# Patient Record
Sex: Male | Born: 1975 | Race: White | Hispanic: No | Marital: Married | Smoking: Former smoker
Health system: Southern US, Community
[De-identification: ages and names within clinical notes are randomized; demographics above are authoritative.]

## PROBLEM LIST (undated history)

## (undated) DIAGNOSIS — I10 Essential (primary) hypertension: Secondary | ICD-10-CM

## (undated) HISTORY — DX: Essential (primary) hypertension: I10

---

## 2006-11-20 ENCOUNTER — Ambulatory Visit: Payer: Self-pay | Admitting: Family Medicine

## 2007-01-22 ENCOUNTER — Ambulatory Visit: Payer: Self-pay | Admitting: Family Medicine

## 2007-01-22 DIAGNOSIS — I10 Essential (primary) hypertension: Secondary | ICD-10-CM | POA: Insufficient documentation

## 2007-01-22 LAB — CONVERTED CEMR LAB
ALT: 39 units/L (ref 0–53)
AST: 26 units/L (ref 0–37)
Albumin: 4.4 g/dL (ref 3.5–5.2)
Alkaline Phosphatase: 64 units/L (ref 39–117)
Calcium: 9.1 mg/dL (ref 8.4–10.5)
Creatinine, Ser: 1.14 mg/dL (ref 0.40–1.50)
Glucose, Bld: 86 mg/dL (ref 70–99)
HDL: 33 mg/dL — ABNORMAL LOW (ref >39)
Potassium: 4.1 meq/L (ref 3.5–5.3)
Total Bilirubin: 1 mg/dL (ref 0.3–1.2)
Triglycerides: 62 mg/dL (ref <150)

## 2007-01-23 ENCOUNTER — Encounter: Payer: Self-pay | Admitting: Family Medicine

## 2007-01-23 ENCOUNTER — Telehealth (INDEPENDENT_AMBULATORY_CARE_PROVIDER_SITE_OTHER): Payer: Self-pay | Admitting: *Deleted

## 2007-02-11 ENCOUNTER — Telehealth: Payer: Self-pay | Admitting: Family Medicine

## 2007-03-05 ENCOUNTER — Ambulatory Visit: Payer: Self-pay | Admitting: Family Medicine

## 2007-03-05 DIAGNOSIS — E785 Hyperlipidemia, unspecified: Secondary | ICD-10-CM | POA: Insufficient documentation

## 2007-03-05 LAB — CONVERTED CEMR LAB
Calcium: 9.6 mg/dL (ref 8.4–10.5)
Sodium: 139 meq/L (ref 135–145)

## 2007-03-06 ENCOUNTER — Telehealth (INDEPENDENT_AMBULATORY_CARE_PROVIDER_SITE_OTHER): Payer: Self-pay | Admitting: *Deleted

## 2007-05-20 ENCOUNTER — Ambulatory Visit: Payer: Self-pay | Admitting: Family Medicine

## 2007-05-20 ENCOUNTER — Encounter: Admission: RE | Admit: 2007-05-20 | Discharge: 2007-05-20 | Payer: Self-pay | Admitting: Family Medicine

## 2007-05-20 DIAGNOSIS — M25539 Pain in unspecified wrist: Secondary | ICD-10-CM

## 2007-06-14 ENCOUNTER — Ambulatory Visit: Payer: Self-pay | Admitting: Family Medicine

## 2007-06-14 ENCOUNTER — Encounter: Admission: RE | Admit: 2007-06-14 | Discharge: 2007-06-14 | Payer: Self-pay | Admitting: Family Medicine

## 2007-07-22 ENCOUNTER — Encounter: Payer: Self-pay | Admitting: Family Medicine

## 2007-07-24 ENCOUNTER — Telehealth: Payer: Self-pay | Admitting: Family Medicine

## 2007-08-16 ENCOUNTER — Encounter: Payer: Self-pay | Admitting: Family Medicine

## 2007-09-25 ENCOUNTER — Ambulatory Visit: Payer: Self-pay | Admitting: Family Medicine

## 2008-03-13 ENCOUNTER — Telehealth: Payer: Self-pay | Admitting: Family Medicine

## 2008-03-16 ENCOUNTER — Ambulatory Visit: Payer: Self-pay | Admitting: Family Medicine

## 2008-03-17 ENCOUNTER — Encounter: Payer: Self-pay | Admitting: Family Medicine

## 2008-03-17 LAB — CONVERTED CEMR LAB
BUN: 12 mg/dL (ref 6–23)
CO2: 21 meq/L (ref 19–32)
Calcium: 9.2 mg/dL (ref 8.4–10.5)
Chloride: 107 meq/L (ref 96–112)
Creatinine, Ser: 0.98 mg/dL (ref 0.40–1.50)
HDL: 38 mg/dL — ABNORMAL LOW (ref >39)
LDL Cholesterol: 83 mg/dL (ref 0–99)
Total Bilirubin: 0.5 mg/dL (ref 0.3–1.2)
Total CHOL/HDL Ratio: 3.6

## 2008-03-18 ENCOUNTER — Encounter: Payer: Self-pay | Admitting: Family Medicine

## 2008-05-04 ENCOUNTER — Ambulatory Visit: Payer: Self-pay | Admitting: Family Medicine

## 2008-05-04 DIAGNOSIS — J1089 Influenza due to other identified influenza virus with other manifestations: Secondary | ICD-10-CM

## 2009-01-05 ENCOUNTER — Ambulatory Visit: Payer: Self-pay | Admitting: Family Medicine

## 2009-01-05 DIAGNOSIS — L03019 Cellulitis of unspecified finger: Secondary | ICD-10-CM | POA: Insufficient documentation

## 2009-01-05 DIAGNOSIS — R509 Fever, unspecified: Secondary | ICD-10-CM

## 2009-01-05 LAB — CONVERTED CEMR LAB
Basophils Absolute: 0 10*3/uL (ref 0.0–0.1)
Basophils Relative: 0 % (ref 0–1)
Hemoglobin: 15 g/dL (ref 13.0–17.0)
Lymphocytes Relative: 20 % (ref 12–46)
MCV: 85.9 fL (ref 78.0–100.0)
Platelets: 232 10*3/uL (ref 150–400)
RDW: 12.4 % (ref 11.5–15.5)
WBC: 5.5 10*3/uL (ref 4.0–10.5)

## 2009-04-09 ENCOUNTER — Ambulatory Visit: Payer: Self-pay | Admitting: Family Medicine

## 2009-04-12 LAB — CONVERTED CEMR LAB
AST: 17 units/L (ref 0–37)
Alkaline Phosphatase: 62 units/L (ref 39–117)
CO2: 25 meq/L (ref 19–32)
Cholesterol: 135 mg/dL (ref 0–200)
Creatinine, Ser: 0.93 mg/dL (ref 0.40–1.50)
Glucose, Bld: 85 mg/dL (ref 70–99)
LDL Cholesterol: 78 mg/dL (ref 0–99)

## 2009-07-02 ENCOUNTER — Ambulatory Visit: Payer: Self-pay | Admitting: Family Medicine

## 2009-07-02 DIAGNOSIS — J0100 Acute maxillary sinusitis, unspecified: Secondary | ICD-10-CM

## 2009-08-04 ENCOUNTER — Telehealth: Payer: Self-pay | Admitting: Family Medicine

## 2010-07-20 NOTE — Progress Notes (Signed)
Summary: change ARB  Phone Note From Pharmacy   Caller: [address] # [phone]* Summary of Call: Pls let pt know that his Benicar 20 mg is not covered by insurance.  I am going to change him to Losartan 50 mg once daily.  Have him return for a nurse BP check in 3 wks to make sure he is at goal.   Initial call taken by: Seymour Bars DO,  August 04, 2009 8:09 AM    New/Updated Medications: LOSARTAN POTASSIUM 50 MG TABS (LOSARTAN POTASSIUM) 1 tab by mouth daily Prescriptions: LOSARTAN POTASSIUM 50 MG TABS (LOSARTAN POTASSIUM) 1 tab by mouth daily  #30 x 2   Entered and Authorized by:   Seymour Bars DO   Signed by:   Seymour Bars DO on 08/04/2009   Method used:   Electronically to        [facility] # [phone]* (retail)       [address]       [place], [place]  [address]       Ph: [phone]       Fax: [phone]   RxID:   [id number]   Appended Document: change ARB Pt's wife aware

## 2010-07-20 NOTE — Assessment & Plan Note (Signed)
Summary: HTN   Vital Signs:  Patient Profile:   35 Years Old Male Height:     71.5 inches Weight:      223 pounds BMI:     30.78 Pulse rate:   73 / minute BP sitting:   135 / 85  (left arm) Cuff size:   large  Vitals Entered By: Harlene Salts (March 16, 2008 8:48 AM)                 Visit Type:  f/u PCP:  Seymour Bars D.O.  Chief Complaint:  follow-up BP.  History of Present Illness: 35 yo WM presents for f/u HTN.  Doing well on Benicar, covered by his insurance.  Ran out of meds about 3 days ago.  Denies  CP, SOB or edema.  Not exercising. Eats a lot of fast food.  Has a lot of stress at work.        Current Allergies: ! 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       Married to [employer].         Has 36 yo daughter       Quit smoking in 1998.  2 ETOH per wk.       Condomns for birth control.         2 sodas a day.       exercises regularly.    Review of Systems      See HPI   Physical Exam  General:     alert, well-developed, well-nourished, and well-hydrated.   Head:     normocephalic and atraumatic.   Eyes:     pupils equal, pupils round, and pupils reactive to light.   Nose:     no nasal discharge.   Mouth:     good dentition.   Neck:     no masses.   Lungs:     normal respiratory effort and normal breath sounds.   Heart:     normal rate, regular rhythm, and no murmur.   Extremities:     No clubbing, cyanosis, edema, or deformity noted  Psych:     good eye contact, not anxious appearing, and not depressed appearing.      Impression &  Recommendations:  Problem # 1:  HYPERTENSION, BENIGN ESSENTIAL (ICD-401.1) BP in pre-HTN range but he is out of his meds.  RF'd Benicar 20 mg and given 3 wks of samples today.  Update labs.  RTC in 6 months.  Needs to work on diet, exercise.  Cut out fast food. His updated medication list for this problem includes:    Benicar 20 Mg Tabs (Olmesartan medoxomil) .Marland Kitchen... 1 tab by mouth daily  Orders: T-Comprehensive Metabolic Panel ([id number])   Problem # 2:  DYSLIPIDEMIA (ICD-272.4) Due for labs.  Not on any meds.  Hx of low HDL.  Discussed diet/ exercise recommendations.   Orders: T-Comprehensive Metabolic Panel [phone]) T-Lipid Profile [phone])  Labs Reviewed: Chol: 110 (01/22/2007)  HDL: 33 (01/22/2007)   LDL: 65 (01/22/2007)   TG: 62 (01/22/2007) SGOT: 26 (01/22/2007)   SGPT: 39 (01/22/2007)   Complete Medication List: 1)  Benicar 20 Mg Tabs (Olmesartan medoxomil) .Marland Kitchen.. 1 tab by mouth daily   Patient Instructions: 1)  Stay on Benicar 20 mg daily. 2)  Have fasting labs drawn and I will mail your results. 3)  Work on [employer], exercise, weight loss. 4)  Follow up for BP/ cholestrol in 6 mos.   Prescriptions: BENICAR 20 MG  TABS (OLMESARTAN MEDOXOMIL) 1 tab by mouth daily  #90 x 2   Entered and Authorized by:   Seymour Bars DO   Signed by:   Seymour Bars DO on 03/16/2008   Method used:   Electronically to        [facility] [phone]* (retail)       [address] [place], [place]  [address]       Ph: [phone]       Fax: [phone]   RxID:   [id number]  ]

## 2010-07-20 NOTE — Assessment & Plan Note (Signed)
Summary: sinusitis   Vital Signs:  Patient profile:   35 year old male Height:      71.5 inches Weight:      222 pounds BMI:     30.64 O2 Sat:      98 % on Room air Temp:     98.8 degrees F oral Pulse rate:   80 / minute BP sitting:   122 / 74  (left arm) Cuff size:   large  Vitals Entered By: Payton Spark CMA (July 02, 2009 1:09 PM)  O2 Flow:  Room air CC: Head congestion, ST and cough x 2 weeks.    Primary Care Provider:  Seymour Bars D.O.  CC:  Head congestion and ST and cough x 2 weeks. Marland Kitchen  History of Present Illness: Andrew Wyatt presents for 2 wks of head congestion, rhinorrhea.  Has had ST which is better and cough.  He had a fever of 100 2 days ago.  He had had bodyaches and HA.  Cough is dry.  No chest tightness.  No SOB.  He is taking Nyquil at night and sudafed during the day.  Mucinex did not help at all.  His daughter has had a cold.      Current Medications (verified): 1)  Benicar 20 Mg Tabs (Olmesartan Medoxomil) .Marland Kitchen.. 1 Tab By Mouth Daily  Allergies (verified): 1)  ! Pcn 2)  Lisinopril (Lisinopril)  Past History:  Past Medical History: Reviewed history from 09/25/2007 and no changes required. HTN  Past Surgical History: Reviewed history from 11/20/2006 and no changes required. Denies surgical history  Social History: Reviewed history from 11/20/2006 and no changes required. 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   Has 75 yo daughter Quit smoking in 1998.  2 ETOH per wk. Condomns for birth control.   2 sodas a day. exercises regularly.  Review of Systems      See HPI  Physical Exam  General:  alert, well-developed, well-nourished, and well-hydrated.   Head:  normocephalic and atraumatic.  frontal and maxillary sinuses TTP Eyes:  conjunctiva clear, EOMI Ears:  EACs patent; TMs translucent and gray with good cone of light and bony landmarks.  Nose:  nasal congesiton present Mouth:  throat w/o injection, clear  postnasal drip Neck:  no masses.   Lungs:  Normal respiratory effort, chest expands symmetrically. Lungs are clear to auscultation, no crackles or wheezes. Heart:  Normal rate and regular rhythm. S1 and S2 normal without gallop, murmur, click, rub or other extra sounds. Skin:  color normal.   Cervical Nodes:  No lymphadenopathy noted   Impression & Recommendations:  Problem # 1:  ACUTE MAXILLARY SINUSITIS (ICD-461.0) 2 wks into illness, starting with a URI.  Treat with Zithromax x 5 days.  Use OTC Mucinex DM and nasal afrin symptomatic relief. Call if not improving in a wk. His updated medication list for this problem includes:    Zithromax Z-pak 250 Mg Tabs (Azithromycin) .Marland Kitchen... 2 tabs by mouth x 1 day then 1 tab by mouth daily x 4 days  Complete Medication List: 1)  Benicar 20 Mg Tabs (Olmesartan medoxomil) .Marland Kitchen.. 1 tab by mouth daily 2)  Zithromax Z-pak 250 Mg Tabs (Azithromycin) .... 2 tabs by mouth x 1 day then 1 tab by mouth daily x 4 days  Patient Instructions: 1)  Take 5 days of Zithromax for sinusitis. 2)  Use OTC Mucinex DM + Afrin nasal spray for symptomatic relief for the next wk. 3)  Use Tylenol or motrin for aches/ pains. 4)  Call if not improved in 7 days. Prescriptions: ZITHROMAX Z-PAK 250 MG TABS (AZITHROMYCIN) 2 tabs by mouth x 1 day then 1 tab by mouth daily x 4 days  #1 pack x 0   Entered and Authorized by:   Seymour Bars DO   Signed by:   Seymour Bars DO on 07/02/2009   Method used:   Electronically to        [facility] # [phone]* (retail)       [address]       [place], [place]  [address]       Ph: [phone]       Fax: [phone]   RxID:   [id number]

## 2010-09-26 ENCOUNTER — Inpatient Hospital Stay (INDEPENDENT_AMBULATORY_CARE_PROVIDER_SITE_OTHER)
Admission: RE | Admit: 2010-09-26 | Discharge: 2010-09-26 | Disposition: A | Discharge status: Home / Self Care | Payer: BLUE CROSS/BLUE SHIELD | Source: Ambulatory Visit | Attending: Emergency Medicine | Admitting: Emergency Medicine

## 2010-09-26 ENCOUNTER — Encounter: Payer: Self-pay | Admitting: Emergency Medicine

## 2010-09-26 DIAGNOSIS — J069 Acute upper respiratory infection, unspecified: Secondary | ICD-10-CM

## 2010-09-29 ENCOUNTER — Telehealth (INDEPENDENT_AMBULATORY_CARE_PROVIDER_SITE_OTHER): Payer: Self-pay | Admitting: *Deleted

## 2010-10-07 ENCOUNTER — Encounter: Payer: Self-pay | Admitting: Family Medicine

## 2010-10-07 ENCOUNTER — Ambulatory Visit (INDEPENDENT_AMBULATORY_CARE_PROVIDER_SITE_OTHER): Payer: BLUE CROSS/BLUE SHIELD | Admitting: Family Medicine

## 2010-10-07 VITALS — BP 132/84 | HR 83 | Temp 98.2°F | Ht 71.5 in | Wt 225.1 lb

## 2010-10-07 DIAGNOSIS — J329 Chronic sinusitis, unspecified: Secondary | ICD-10-CM

## 2010-10-07 MED ORDER — AZITHROMYCIN 250 MG PO TABS: ORAL_TABLET | ORAL | Status: AC

## 2010-10-07 NOTE — Patient Instructions (Signed)
Take 5 days of Zithromax for prolonged respiratory infection.  Take OTC Zyrtec each night for seasonal allergies. Take Advil cold and sinus for the next 7-10 days.

## 2010-10-07 NOTE — Progress Notes (Signed)
  Subjective:    Patient ID: Andrew Wyatt, male    DOB: [birth year], 35 y.o.   MRN: [id number]  HPI  35 yo WM presents for a sore throat, cough and runny nose x 3 wks.  He went to UC 2 wks and was tested for mono and was neg.  Tried almost every OTC anti histamine.  Using Afrin nasal spray which has helped a little bit.  He continues to suffer with congestion, fatigue, cough.  No fevers or chills.  Also has underlying allergies.    BP 132/84  Pulse 83  Temp(Src) 98.2 F (36.8 C) (Oral)  Ht 5' 11.5" (1.816 m)  Wt 225 lb 1.3 oz (102.096 kg)  BMI 30.95 kg/m2  SpO2 97%   Review of Systems  Constitutional: Positive for fatigue. Negative for fever and chills.  HENT: Positive for congestion, rhinorrhea, sneezing and postnasal drip. Negative for ear pain, neck pain and sinus pressure.   Eyes: Negative for itching.  Respiratory: Positive for cough. Negative for shortness of breath and wheezing.   Cardiovascular: Negative for chest pain.  Gastrointestinal: Negative for nausea.  Skin: Negative for rash.  Neurological: Negative for headaches.        Objective:   Physical Exam  Constitutional: He appears well-developed and well-nourished. No distress.  HENT:  Head: Normocephalic and atraumatic.  Right Ear: External ear normal.  Left Ear: External ear normal.  Mouth/Throat: Oropharynx is clear and moist. No oropharyngeal exudate.       Nasal congestion, boggy turbinates, o/p injected  Eyes: Conjunctivae are normal.  Neck: Neck supple.  Cardiovascular: Normal rate, regular rhythm and normal heart sounds.   Pulmonary/Chest: Effort normal and breath sounds normal. No respiratory distress. He has no wheezes.  Lymphadenopathy:    He has no cervical adenopathy.  Skin: Skin is warm and dry. No rash noted.  Psychiatric: He has a normal mood and affect.          Assessment & Plan:  1. Sinusitis- likely early ABS following URI with underlying allergies x 3 wks of symptoms and not  improving with multiple OTC agents.  Will cover with 5 days of Zithromax and short term use of Advil cold and sinus.  Call if not improvied in 19 days.

## 2010-11-29 ENCOUNTER — Telehealth: Payer: Self-pay | Admitting: Family Medicine

## 2010-11-29 NOTE — Telephone Encounter (Signed)
Pt has to try and fail Claritin, Allegra and Zyrtec to qualify for Rx anti histamines. So, if he has not tried Claritin or Zyrtec, I would recommend one of these.

## 2010-11-29 NOTE — Telephone Encounter (Addendum)
Pts wife called and pt has been taking zyrtec 10 mg daily, and he doesn't feel it's working and he would like a diff antihistamine.  Doesn't like taking benadryl because it causes sleepiness.    Please advise. Plan:  Routed to Dr. Arlice Colt, LPN Andrew Wyatt   69-62-95 @ 8:39pm  Pt notified to try another OTC antihistamine either claritin or allegra since he has already tried the zyrtec and it works but not completely.  Told him there has to be a fail of 3 antihistamines before we can give a prescription type antihistamine.  Pt voiced understanding. Andrew Newcomer, LPN Andrew Wyatt

## 2010-12-04 ENCOUNTER — Other Ambulatory Visit: Payer: Self-pay | Admitting: Family Medicine

## 2010-12-05 ENCOUNTER — Emergency Department (HOSPITAL_BASED_OUTPATIENT_CLINIC_OR_DEPARTMENT_OTHER)
Admission: EM | Admit: 2010-12-05 | Discharge: 2010-12-05 | Disposition: A | Discharge status: Home / Self Care | Payer: BC Managed Care – PPO | Attending: Emergency Medicine | Admitting: Emergency Medicine

## 2010-12-05 DIAGNOSIS — S93409A Sprain of unspecified ligament of unspecified ankle, initial encounter: Secondary | ICD-10-CM | POA: Insufficient documentation

## 2010-12-05 DIAGNOSIS — Y9373 Activity, racquet and hand sports: Secondary | ICD-10-CM | POA: Insufficient documentation

## 2010-12-05 DIAGNOSIS — X500XXA Overexertion from strenuous movement or load, initial encounter: Secondary | ICD-10-CM | POA: Insufficient documentation

## 2010-12-05 DIAGNOSIS — I10 Essential (primary) hypertension: Secondary | ICD-10-CM | POA: Insufficient documentation

## 2010-12-06 ENCOUNTER — Emergency Department (INDEPENDENT_AMBULATORY_CARE_PROVIDER_SITE_OTHER): Payer: BC Managed Care – PPO

## 2010-12-06 DIAGNOSIS — M25579 Pain in unspecified ankle and joints of unspecified foot: Secondary | ICD-10-CM

## 2010-12-06 DIAGNOSIS — M7730 Calcaneal spur, unspecified foot: Secondary | ICD-10-CM

## 2010-12-06 DIAGNOSIS — W19XXXA Unspecified fall, initial encounter: Secondary | ICD-10-CM

## 2011-02-14 ENCOUNTER — Telehealth: Payer: Self-pay | Admitting: Family Medicine

## 2011-02-14 NOTE — Telephone Encounter (Signed)
Closed

## 2011-02-16 ENCOUNTER — Ambulatory Visit (INDEPENDENT_AMBULATORY_CARE_PROVIDER_SITE_OTHER): Payer: BLUE CROSS/BLUE SHIELD | Admitting: Family Medicine

## 2011-02-16 DIAGNOSIS — Z23 Encounter for immunization: Secondary | ICD-10-CM

## 2011-02-16 NOTE — Progress Notes (Signed)
Here for Tdap

## 2011-03-08 ENCOUNTER — Telehealth: Payer: Self-pay | Admitting: Family Medicine

## 2011-03-08 NOTE — Telephone Encounter (Signed)
Pt's wife called and said pharm had requested pt BP refill, but trouble getting filled. Plan:  Reviewed the pt chart and the pt has not been seen for his BP since 2010.  Pt's wife informed that pt needs to come in for follow up for BP.  Refilled 30 day supply of losartan, and scheduled pt an appt for 03-13-11. Jarvis Newcomer, LPN [facility]

## 2011-03-12 ENCOUNTER — Encounter: Payer: Self-pay | Admitting: Family Medicine

## 2011-03-13 ENCOUNTER — Encounter: Payer: Self-pay | Admitting: Family Medicine

## 2011-03-13 ENCOUNTER — Ambulatory Visit (INDEPENDENT_AMBULATORY_CARE_PROVIDER_SITE_OTHER): Payer: BLUE CROSS/BLUE SHIELD | Admitting: Family Medicine

## 2011-03-13 VITALS — BP 142/94 | HR 58 | Wt 227.0 lb

## 2011-03-13 DIAGNOSIS — I10 Essential (primary) hypertension: Secondary | ICD-10-CM

## 2011-03-13 DIAGNOSIS — E785 Hyperlipidemia, unspecified: Secondary | ICD-10-CM

## 2011-03-13 LAB — COMPLETE METABOLIC PANEL WITH GFR
ALT: 24 U/L (ref 0–53)
AST: 20 U/L (ref 0–37)
Albumin: 4.4 g/dL (ref 3.5–5.2)
BUN: 11 mg/dL (ref 6–23)
CO2: 28 mEq/L (ref 19–32)
GFR, Est African American: >60 mL/min (ref >60)
Potassium: 4 mEq/L (ref 3.5–5.3)
Sodium: 136 mEq/L (ref 135–145)
Total Bilirubin: 0.5 mg/dL (ref 0.3–1.2)

## 2011-03-13 LAB — LIPID PANEL
Cholesterol: 130 mg/dL (ref 0–200)
LDL Cholesterol: 70 mg/dL (ref 0–99)
Total CHOL/HDL Ratio: 4.1 Ratio

## 2011-03-13 MED ORDER — LOSARTAN POTASSIUM 100 MG PO TABS: 100.0000 mg | ORAL_TABLET | Freq: Every day | ORAL | Status: DC

## 2011-03-13 NOTE — Progress Notes (Signed)
  Subjective:    Patient ID: Andrew Wyatt, male    DOB: 08/03/1975, 35 y.o.   MRN: [id number]  HPI  He is here today for followup for his blood pressure as he ran out of medications. It has been over a year since he has been seen since his last blood work. His last blood work was in 2010. He denies any chest pain or shortness of breath. He took his last his medication last night. He feels well on it, tolerates it well and has no side effects. He says he is very compliant with his medication.  Review of Systems     Objective:   Physical Exam  Constitutional: He is oriented to person, place, and time. He appears well-developed and well-nourished.  HENT:  Head: Normocephalic and atraumatic.  Neck: Neck supple. No thyromegaly present.  Cardiovascular: Normal rate, regular rhythm and normal heart sounds.        No carotid bruits  Pulmonary/Chest: Effort normal and breath sounds normal.  Lymphadenopathy:    He has no cervical adenopathy.  Neurological: He is alert and oriented to person, place, and time.  Skin: Skin is warm and dry.  Psychiatric: He has a normal mood and affect. His behavior is normal.          Assessment & Plan:  Hypertension-almost a call-discussed increasing the losartan 100 mg one time a day. He can call for any concerns. Continue watching the salt intake in his diet and getting some regular exercise.  Declines flu vaccine today.

## 2011-03-13 NOTE — Assessment & Plan Note (Signed)
Due to recheck labs to make sure it is at goal. Lab slip given today.

## 2011-03-14 ENCOUNTER — Telehealth: Payer: Self-pay | Admitting: *Deleted

## 2011-03-14 NOTE — Telephone Encounter (Signed)
Pt notified of results and instructions. KJ LPN 

## 2011-03-14 NOTE — Telephone Encounter (Signed)
Message copied by Lanae Crumbly on Tue Mar 14, 2011  2:44 PM ------      Message from: Nani Gasser D      Created: Mon Mar 13, 2011  8:59 PM       cmp and chol look good excpet HDL ( good chol ) is low. REgular exercise will bring this up.

## 2011-05-22 NOTE — Telephone Encounter (Signed)
  Phone Note Outgoing Call Call back at [facility] Phone [phone]   Call placed by: Lajean Saver RN,  September 29, 2010 9:59 AM Call placed to: Patient Action Taken: Phone Call Completed Summary of Call: Callback: patient reports his throat is better but he is still congested. I advised him to take Zyrtec D. Throat culture result given.

## 2011-05-22 NOTE — Progress Notes (Signed)
Summary: SORE THROAT/TJ rm 4   Vital Signs:  Patient Profile:   35 Years Old Male CC:      sore throat x today Height:     71.5 inches Weight:      224.75 pounds O2 Sat:      97 % O2 treatment:    Room Air Temp:     98.4 degrees F oral Pulse rate:   77 / minute Resp:     18 per minute BP sitting:   125 / 87  (left arm) Cuff size:   large  Vitals Entered By: Clemens Catholic LPN (September 25, 9809 7:17 PM)                  Updated Prior Medication List: LOSARTAN POTASSIUM 50 MG TABS (LOSARTAN POTASSIUM) 1 tab by mouth daily  Current Allergies (reviewed today): ! PCN LISINOPRIL (LISINOPRIL)History of Present Illness History from: patient Chief Complaint: sore throat x today History of Present Illness: 35 Years Old Male complains of onset of cold symptoms for 2 days.  Andrew Wyatt has been using no OTC meds.  He was exposed to someone with Mono 2 days ago.   + sore throat No cough No pleuritic pain No wheezing No nasal congestion No post-nasal drainage No sinus pain/pressure No chest congestion No itchy/red eyes No earache No hemoptysis No SOB No chills/sweats No fever No nausea No vomiting No abdominal pain No diarrhea No skin rashes No fatigue No myalgias No headache   REVIEW OF SYSTEMS Constitutional Symptoms      Denies fever, chills, night sweats, weight loss, weight gain, and fatigue.  Eyes       Denies change in vision, eye pain, eye discharge, glasses, contact lenses, and eye surgery. Ear/Nose/Throat/Mouth       Complains of sore throat.      Denies hearing loss/aids, change in hearing, ear pain, ear discharge, dizziness, frequent runny nose, frequent nose bleeds, sinus problems, hoarseness, and tooth pain or bleeding.  Respiratory       Denies dry cough, productive cough, wheezing, shortness of breath, asthma, bronchitis, and emphysema/COPD.  Cardiovascular       Denies murmurs, chest pain, and tires easily with exhertion.    Gastrointestinal  Denies stomach pain, nausea/vomiting, diarrhea, constipation, blood in bowel movements, and indigestion. Genitourniary       Denies painful urination, kidney stones, and loss of urinary control. Neurological       Complains of headaches.      Denies paralysis, seizures, and fainting/blackouts. Musculoskeletal       Denies muscle pain, joint pain, joint stiffness, decreased range of motion, redness, swelling, muscle weakness, and gout.  Skin       Denies bruising, unusual mles/lumps or sores, and hair/skin or nail changes.  Psych       Denies mood changes, temper/anger issues, anxiety/stress, speech problems, depression, and sleep problems. Other Comments: pt states that his friend tested positive for mono on Saturday and today he developed sore throat and a little HA.   Past History:  Past Medical History: Reviewed history from 09/25/2007 and no changes required. HTN  Past Surgical History: Reviewed history from 11/20/2006 and no changes required. Denies surgical history  Family History: father died at 4 from AMI mother diabetes sister and brother healthy Physical Exam General appearance: well developed, well nourished, no acute distress Ears: normal, no lesions or deformities Nasal: mucosa pink, nonedematous, no septal deviation, turbinates normal Oral/Pharynx: tongue normal, posterior pharynx without erythema  or exudate Chest/Lungs: no rales, wheezes, or rhonchi bilateral, breath sounds equal without effort Heart: regular rate and  rhythm, no murmur MSE: oriented to time, place, and person Assessment New Problems: URI (ICD-465.9)   Plan New Orders: Est. Patient Level IV [99214] Services provided After hours-Weekends-Holidays [99051] Pulse Oximetry (single measurment) [94760] Rapid Strep [87880] T-Culture, Throat [[id number]] Planning Comments:   1)  No antibiotic treatment warranted.  Rapid strep neg.  Culture pending. 2)  Use nasal saline solution (over the  counter) at least 3 times a day. 3)  Use over the counter decongestants like Zyrtec-D every 12 hours as needed to help with congestion. 4)  Can take tylenol every 6 hours or motrin every 8 hours for pain or fever. 5)  Follow up with your primary doctor  if no improvement in 5-7 days, sooner if increasing pain, fever, or new symptoms.  Call back in a few days if not improving.  May choose to do Monospot at that time.  Currently is too soon for a positive result.   The patient and/or caregiver has been counseled thoroughly with regard to medications prescribed including dosage, schedule, interactions, rationale for use, and possible side effects and they verbalize understanding.  Diagnoses and expected course of recovery discussed and will return if not improved as expected or if the condition worsens. Patient and/or caregiver verbalized understanding.   Orders Added: 1)  Est. Patient Level IV [09811] 2)  Services provided After hours-Weekends-Holidays [99051] 3)  Pulse Oximetry (single measurment) [94760] 4)  Rapid Strep [91478] 5)  T-Culture, Throat [[id number]]  Appended Document: SORE THROAT/TJ rm 4 rapid strep test: negative

## 2011-07-10 ENCOUNTER — Other Ambulatory Visit: Payer: Self-pay | Admitting: *Deleted

## 2011-07-10 MED ORDER — LOSARTAN POTASSIUM 100 MG PO TABS: 100.0000 mg | ORAL_TABLET | Freq: Every day | ORAL | Status: DC

## 2011-09-01 ENCOUNTER — Encounter: Payer: Self-pay | Admitting: Family Medicine

## 2011-09-01 ENCOUNTER — Ambulatory Visit (INDEPENDENT_AMBULATORY_CARE_PROVIDER_SITE_OTHER): Payer: BC Managed Care – PPO | Admitting: Family Medicine

## 2011-09-01 VITALS — BP 130/80 | HR 75 | Ht 71.5 in | Wt 231.0 lb

## 2011-09-01 DIAGNOSIS — R21 Rash and other nonspecific skin eruption: Secondary | ICD-10-CM

## 2011-09-01 MED ORDER — PREDNISONE 20 MG PO TABS: 40.0000 mg | ORAL_TABLET | Freq: Every day | ORAL | Status: DC

## 2011-09-01 MED ORDER — PREDNISONE 20 MG PO TABS: 40.0000 mg | ORAL_TABLET | Freq: Every day | ORAL | Status: AC

## 2011-09-01 NOTE — Patient Instructions (Signed)
Call if not better in one week.  

## 2011-09-01 NOTE — Progress Notes (Signed)
  Subjective:    Patient ID: Andrew Wyatt, male    DOB: Aug 22, 1975, 36 y.o.   MRN: [id number]  HPI red,small lesion on the left abd, left abd feels sore and tender. Started one week ago.  Says started as a red area bout 3-4 inches with 2 pimples in the center.  Had white heads on them and they got better and now just has 2 tiny scabs. Pain radiates over the dermatome. Lesion is better but the pain is worse.  Not taking any meds for pain. Hx of chicken pox. No spider bites or bug bites.  No worsening or alleviating symptoms.    Review of Systems     Objective:   Physical Exam  Constitutional: He is oriented to person, place, and time. He appears well-developed and well-nourished.  HENT:  Head: Normocephalic and atraumatic.  Neck: Neck supple. No thyromegaly present.  Cardiovascular: Normal rate, regular rhythm and normal heart sounds.   Pulmonary/Chest: Effort normal and breath sounds normal.  Lymphadenopathy:    He has no cervical adenopathy.  Neurological: He is alert and oriented to person, place, and time.  Skin: Skin is warm and dry.       Pale pick oval rash apporx 2 x 3 inches with 2 very tiny healing scabs in the center. Pain is over that dermatome though  Psychiatric: He has a normal mood and affect. His behavior is normal.          Assessment & Plan:  Rash- Suspect viral lesion like shingles - Trial of steroids for sxs relief. Offered topical lidocaine. Shingles - Suspect shingles based on history. He really only has 2 lesions that seem to be healing well. His pain is in a dermatome distribution. There is nothing really I can culture today. I will have him on a short course of prednisone to see if this provides relief. If he is not improving in the next week or 2 then placement of appointment. At that time he is not doing well his pain persists or he starts to have fevers then we can check other lab work including a CBC.

## 2011-09-12 ENCOUNTER — Encounter: Payer: Self-pay | Admitting: Physician Assistant

## 2011-09-12 ENCOUNTER — Ambulatory Visit (INDEPENDENT_AMBULATORY_CARE_PROVIDER_SITE_OTHER): Payer: BC Managed Care – PPO | Admitting: Physician Assistant

## 2011-09-12 VITALS — BP 120/70 | HR 75 | Wt 231.0 lb

## 2011-09-12 DIAGNOSIS — J069 Acute upper respiratory infection, unspecified: Secondary | ICD-10-CM

## 2011-09-12 MED ORDER — FLUTICASONE PROPIONATE 50 MCG/ACT NA SUSP: 2.0000 | Freq: Every day | NASAL | Status: AC

## 2011-09-12 MED ORDER — HYDROCODONE-HOMATROPINE 5-1.5 MG/5ML PO SYRP
2.5000 mL | ORAL_SOLUTION | Freq: Every evening | ORAL | Status: AC | PRN
Start: 2011-09-12 — End: 2011-09-22

## 2011-09-12 NOTE — Patient Instructions (Signed)
Stay on Claritin. Add some type of decongestant like sudafed for the next 4 days. Consider using Lloyd Huger Med sinus rinse a couple times day. Will give Flonase to use 2 sprays once a day. Use cough syrup at bedime.   For Allergies: Consider Lloyd Huger Med Sinus rinse to use during allergy season.

## 2011-09-12 NOTE — Progress Notes (Signed)
  Subjective:    Patient ID: Andrew Wyatt, male    DOB: March 02, 1976, 36 y.o.   MRN: [id number]  HPI Patient has had on and off symptoms of cough and congestion for 3 weeks. He has been on prednisone for 5 days for a rash on his left side. He denies any fever, sinus pressure, SOB, wheezing, headaches, sore throat, or ear pain. He has had a cough with a little bit of production and nasal congestion. Has not tried anything OTC to make better.      Review of Systems     Objective:   Physical Exam  Constitutional: He is oriented to person, place, and time. He appears well-developed and well-nourished.  HENT:  Head: Normocephalic and atraumatic.  Right Ear: External ear normal.  Left Ear: External ear normal.  Mouth/Throat: Oropharynx is clear and moist.       TM's normal with some air bubbles seen. Turbinates swollen. No maxillary tenderness.  Eyes:       Conjunctiva red and irritated.  Neck: Normal range of motion. Neck supple.  Cardiovascular: Normal rate, regular rhythm and normal heart sounds.   Pulmonary/Chest: Effort normal and breath sounds normal. He has no wheezes.  Lymphadenopathy:    He has no cervical adenopathy.  Neurological: He is alert and oriented to person, place, and time.  Skin: Skin is warm and dry.  Psychiatric: He has a normal mood and affect. His behavior is normal.          Assessment & Plan:  URI-allergies might also be a contributing factor. Stay on Claritin. Add some type of decongestant like sudafed for the next 4 days. Consider using Lloyd Huger Med sinus rinse a couple times day. Will give Flonase to use 2 sprays once a day. Use cough syrup at bedime.   For Allergies: Consider Lloyd Huger Med Sinus rinse to use during allergy season.

## 2011-11-11 ENCOUNTER — Other Ambulatory Visit: Payer: Self-pay | Admitting: Family Medicine

## 2011-11-15 ENCOUNTER — Other Ambulatory Visit: Payer: Self-pay | Admitting: *Deleted

## 2011-11-15 MED ORDER — LOSARTAN POTASSIUM 100 MG PO TABS: 100.0000 mg | ORAL_TABLET | Freq: Every day | ORAL | Status: DC

## 2011-11-16 ENCOUNTER — Encounter: Payer: Self-pay | Admitting: *Deleted

## 2011-11-24 ENCOUNTER — Ambulatory Visit: Payer: BC Managed Care – PPO | Admitting: Physician Assistant

## 2011-12-04 ENCOUNTER — Ambulatory Visit (INDEPENDENT_AMBULATORY_CARE_PROVIDER_SITE_OTHER): Payer: BC Managed Care – PPO | Admitting: Family Medicine

## 2011-12-04 VITALS — BP 129/71 | HR 95

## 2011-12-04 DIAGNOSIS — I10 Essential (primary) hypertension: Secondary | ICD-10-CM

## 2011-12-04 MED ORDER — LOSARTAN POTASSIUM 100 MG PO TABS: 100.0000 mg | ORAL_TABLET | Freq: Every day | ORAL | Status: DC

## 2011-12-04 NOTE — Progress Notes (Signed)
  Subjective:    Patient ID: Andrew Wyatt, male    DOB: 31-Mar-1976, 36 y.o.   MRN: [id number] BP checkHPI    Review of Systems     Objective:   Physical Exam        Assessment & Plan:  HTN - well controlled on current regimen.

## 2011-12-04 NOTE — Progress Notes (Signed)
  Subjective:    Patient ID: Andrew Wyatt, male    DOB: 06-04-1976, 36 y.o.   MRN: [id number]  HPI    Review of Systems     Objective:   Physical Exam        Assessment & Plan:  HTN - Well controlled. Today. F/U in 6 months.  Check BMP. Can go anytime.

## 2012-04-16 ENCOUNTER — Other Ambulatory Visit: Payer: Self-pay | Admitting: Family Medicine

## 2012-06-11 IMAGING — CR DG ANKLE COMPLETE 3+V*L*
3 series · 3 of 3 positions shown · non-contrast
Comparison: None

CLINICAL DATA: Trauma.  Status post fall.

LEFT ANKLE COMPLETE - 3+ VIEW

[t ankle joint ap left]
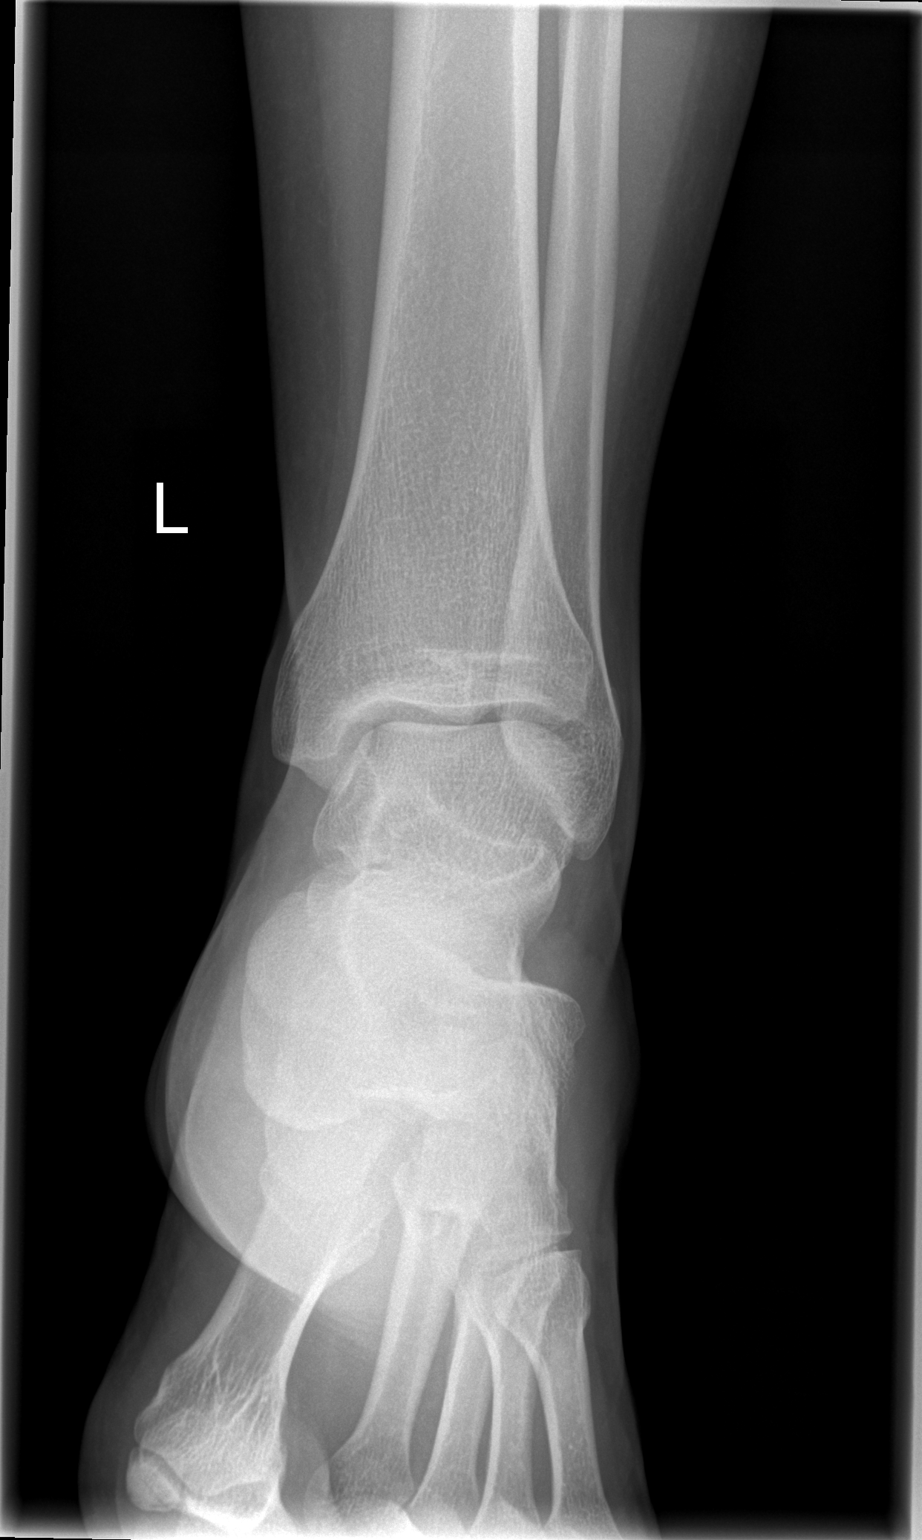

[t ankle joint oblique left]
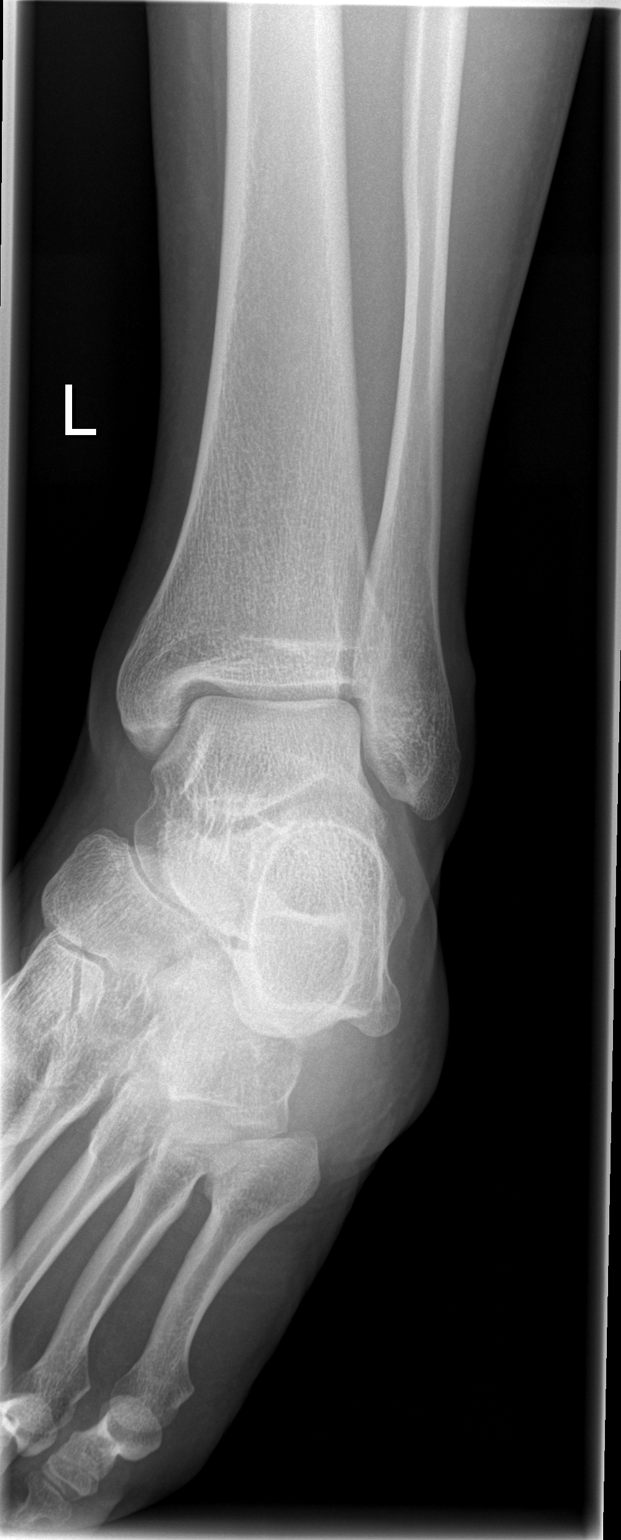

[t ankle joint lat left]
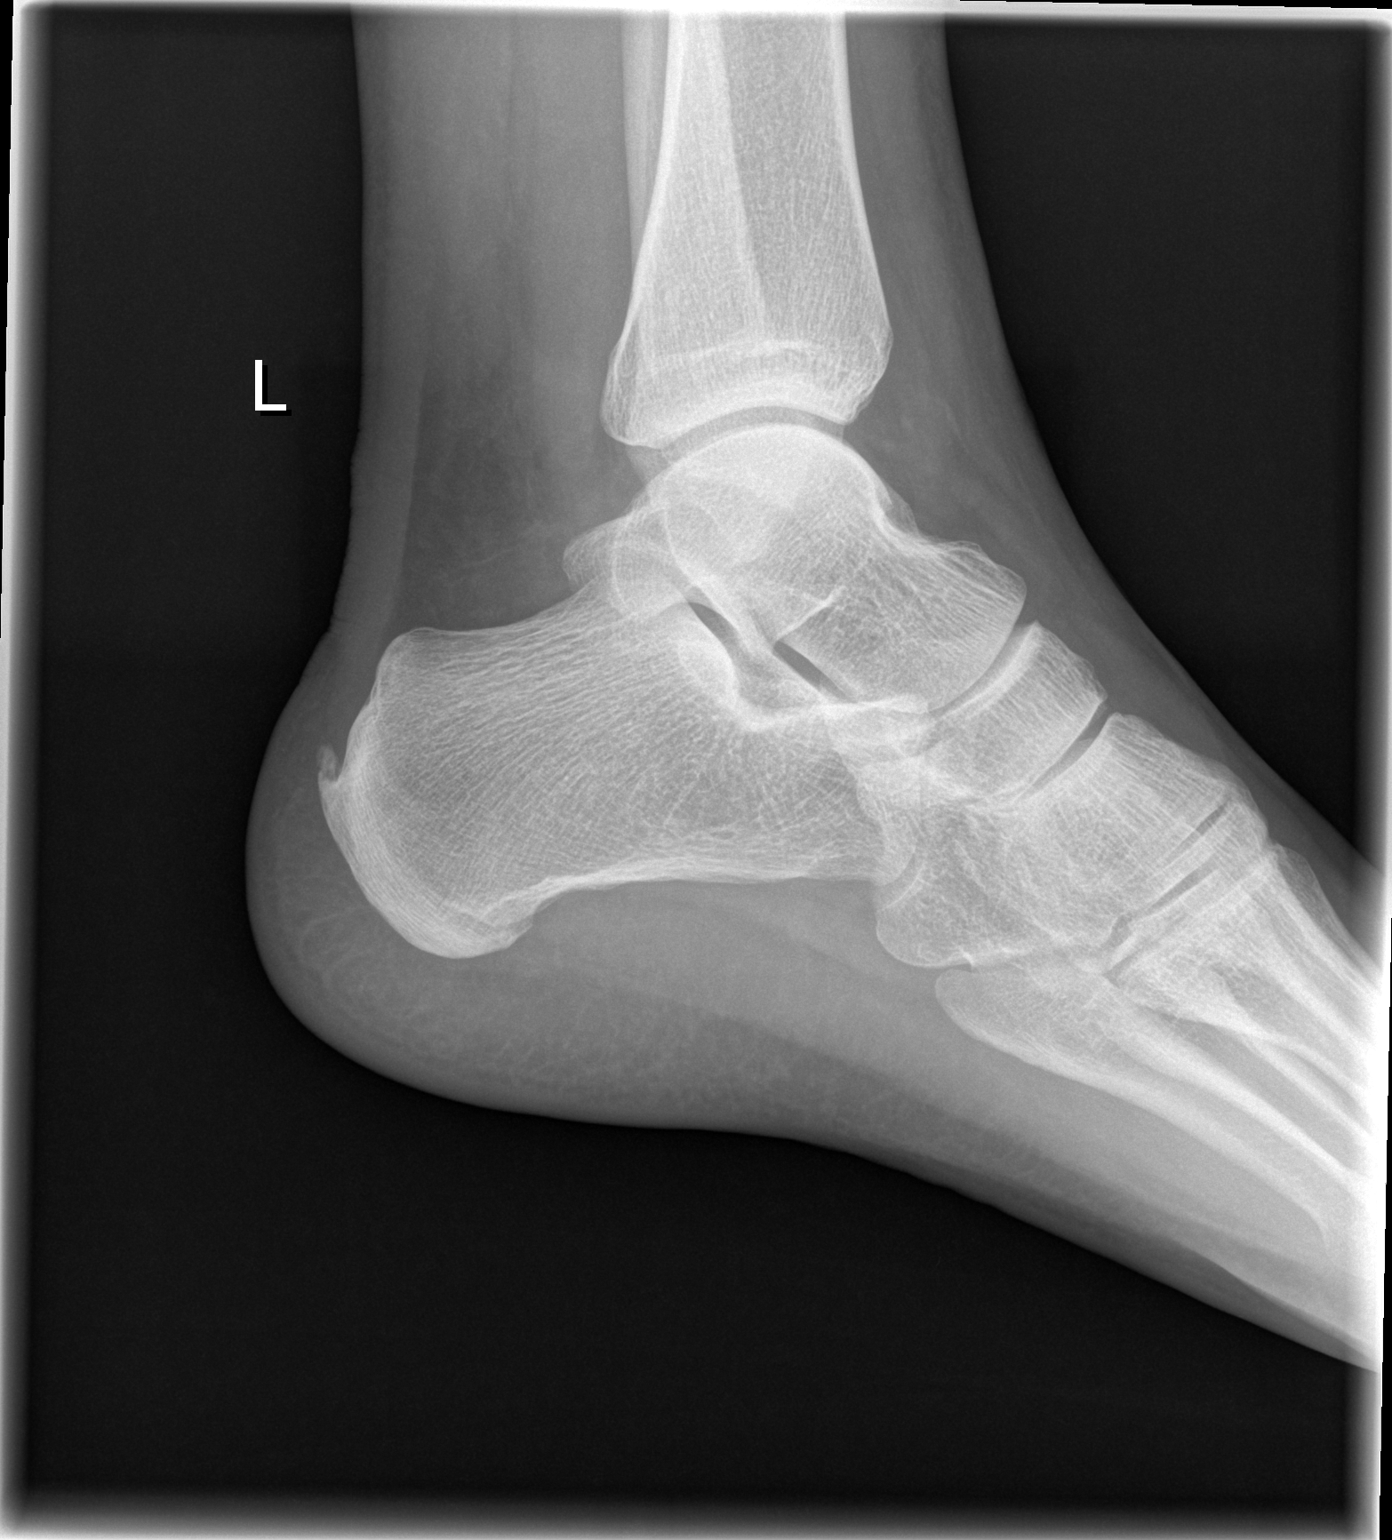

[3 of 3 positions shown; findings below may reference images not displayed]

FINDINGS: There is no evidence of fracture or dislocation. Small
posterior calcaneal heel spur.  There is no evidence of arthropathy
or other focal bone abnormality.  Soft tissues are unremarkable.
IMPRESSION: 1.  No acute findings identified.
2.  Heel spur.

## 2012-09-21 ENCOUNTER — Other Ambulatory Visit: Payer: Self-pay | Admitting: Family Medicine

## 2012-09-21 DIAGNOSIS — I10 Essential (primary) hypertension: Secondary | ICD-10-CM

## 2012-09-22 NOTE — Telephone Encounter (Signed)
I called patient and advised him he needs a follow up appointment for his blood pressure. He stated he would call back and schedule an appointment.

## 2012-10-28 ENCOUNTER — Encounter: Payer: Self-pay | Admitting: Physician Assistant

## 2012-10-28 ENCOUNTER — Other Ambulatory Visit: Payer: Self-pay | Admitting: Family Medicine

## 2012-10-28 ENCOUNTER — Ambulatory Visit (INDEPENDENT_AMBULATORY_CARE_PROVIDER_SITE_OTHER): Payer: BC Managed Care – PPO | Admitting: Physician Assistant

## 2012-10-28 VITALS — BP 126/82 | HR 78 | Wt 223.0 lb

## 2012-10-28 DIAGNOSIS — Z13220 Encounter for screening for lipoid disorders: Secondary | ICD-10-CM

## 2012-10-28 DIAGNOSIS — Z131 Encounter for screening for diabetes mellitus: Secondary | ICD-10-CM

## 2012-10-28 DIAGNOSIS — Z79899 Other long term (current) drug therapy: Secondary | ICD-10-CM

## 2012-10-28 DIAGNOSIS — E785 Hyperlipidemia, unspecified: Secondary | ICD-10-CM

## 2012-10-28 DIAGNOSIS — I10 Essential (primary) hypertension: Secondary | ICD-10-CM

## 2012-10-28 LAB — COMPREHENSIVE METABOLIC PANEL
ALT: 23 U/L (ref 0–53)
AST: 17 U/L (ref 0–37)
Albumin: 4.3 g/dL (ref 3.5–5.2)
Creat: 0.84 mg/dL (ref 0.50–1.35)
Total Protein: 7.3 g/dL (ref 6.0–8.3)

## 2012-10-28 LAB — LIPID PANEL
LDL Cholesterol: 67 mg/dL (ref 0–99)
VLDL: 24 mg/dL (ref 0–40)

## 2012-10-28 MED ORDER — LOSARTAN POTASSIUM 100 MG PO TABS: 100.0000 mg | ORAL_TABLET | Freq: Every day | ORAL | Status: AC

## 2012-10-28 NOTE — Progress Notes (Signed)
  Subjective:    Patient ID: Andrew Wyatt, male    DOB: [birth year], 37 y.o.   MRN: [id number]  HPI Patient presents to the clinic for med refills for HTN. Denies any problems today. Does not check BP regularly. Takes cozaar every night without complications. Denies any CP, palpitations, SOB, HA's, vision changes. Has stopped drinking soft drinks but eats whatever he wants. Trying to start working out.    Review of Systems     Objective:   Physical Exam  Constitutional: He is oriented to person, place, and time. He appears well-developed and well-nourished.  HENT:  Head: Normocephalic and atraumatic.  Eyes: Conjunctivae are normal.  Neck: Normal range of motion. Neck supple. No thyromegaly present.  Cardiovascular: Normal rate, regular rhythm and normal heart sounds.   Pulmonary/Chest: Effort normal and breath sounds normal.  Neurological: He is alert and oriented to person, place, and time.  Skin: Skin is warm and dry.  Psychiatric: He has a normal mood and affect. His behavior is normal.          Assessment & Plan:  HTN/dyslipidemia- Rechecked BP and was great. Refilled medication for 6 months. Would like for pt to continue with diet and exercise and see if we can't get him off medication. Needs CMP checked. Also gave slip for cholesterol. Reminded of low salt diet. Follow up in 6 months.

## 2013-02-07 ENCOUNTER — Encounter: Payer: Self-pay | Admitting: Physician Assistant

## 2013-02-07 ENCOUNTER — Ambulatory Visit (INDEPENDENT_AMBULATORY_CARE_PROVIDER_SITE_OTHER): Payer: BC Managed Care – PPO | Admitting: Physician Assistant

## 2013-02-07 VITALS — BP 124/67 | HR 78 | Wt 223.0 lb

## 2013-02-07 DIAGNOSIS — L259 Unspecified contact dermatitis, unspecified cause: Secondary | ICD-10-CM

## 2013-02-07 DIAGNOSIS — L237 Allergic contact dermatitis due to plants, except food: Secondary | ICD-10-CM

## 2013-02-07 DIAGNOSIS — L255 Unspecified contact dermatitis due to plants, except food: Secondary | ICD-10-CM

## 2013-02-07 MED ORDER — CLOBETASOL PROPIONATE 0.05 % EX GEL: 1.0000 "application " | Freq: Two times a day (BID) | CUTANEOUS | Status: AC

## 2013-02-07 NOTE — Progress Notes (Signed)
  Subjective:    Patient ID: Andrew Wyatt, male    DOB: 08/09/1975, 37 y.o.   MRN: [id number]  HPI Patient presents to the clinic with a rash on his lower right calf that has been present for a couple weeks. He originally thought it was poison ivy and he sprayed some over-the-counter anti-itch spray on it. It did seem to get some better. It is very itchy. He denies any pain associated with that. It does seem to be getting a little bigger and more red. He has never had poison ivy before. He denies on any type of bug bite or sting.  He denies feeling bad, any fever, chills, nausea, vomiting.   Review of Systems     Objective:   Physical Exam  Skin:             Assessment & Plan:  Contact dermatitis/poison ivy- Gave clobetasol cream to use BID for up to 2 weeks. Anti-histamine was discussed to help with itching. Call if not improving. Keep clean.

## 2013-06-13 ENCOUNTER — Emergency Department
Admission: EM | Admit: 2013-06-13 | Discharge: 2013-06-13 | Disposition: A | Discharge status: Home / Self Care | Payer: BC Managed Care – PPO | Source: Home / Self Care | Attending: Family Medicine | Admitting: Family Medicine

## 2013-06-13 ENCOUNTER — Encounter: Payer: Self-pay | Admitting: Emergency Medicine

## 2013-06-13 DIAGNOSIS — H6991 Unspecified Eustachian tube disorder, right ear: Secondary | ICD-10-CM

## 2013-06-13 DIAGNOSIS — H6990 Unspecified Eustachian tube disorder, unspecified ear: Secondary | ICD-10-CM

## 2013-06-13 MED ORDER — PREDNISONE 20 MG PO TABS: 20.0000 mg | ORAL_TABLET | Freq: Two times a day (BID) | ORAL | Status: AC

## 2013-06-13 NOTE — ED Notes (Signed)
Reports 3 day history of right ear feeling "plugged". Tried home remedies without relief.

## 2013-06-13 NOTE — Discharge Instructions (Signed)
Take plain Mucinex (1200 mg guaifenesin) twice daily for cough and congestion.  Increase fluid intake May use Afrin nasal spray (or generic oxymetazoline) twice daily for about 5 days.  Also recommend using saline nasal spray several times daily and saline nasal irrigation (AYR is a common brand) Try gently equalizing pressure in ears several times daily; chewing gum can help. Stop all antihistamines for now, and other non-prescription cough/cold preparations.

## 2013-06-13 NOTE — ED Provider Notes (Signed)
CSN: [id number]     Arrival date & time 06/13/13  1357 History   First MD Initiated Contact with Patient 06/13/13 1453     Chief Complaint  Patient presents with  . Ear Fullness      HPI Comments: Patient complains of two day history of right ear feeling clogged without pain.  No sinus congestion, sore throat, fever etc.  No ear drainage.  He states that he is unable to equalize the pressure in his right ear, and hearing is decreased.  He has a history of seasonal allergies, but not usually this time of year.  He occasionally uses a prescription steroid nasal spray.  The history is provided by the patient.    Past Medical History  Diagnosis Date  . Hypertension    History reviewed. No pertinent past surgical history. Family History  Problem Relation Age of Onset  . Diabetes Mother   . Obesity Mother   . Heart disease Father   . Other Father     CHF  . Hypertension Brother    History  Substance Use Topics  . Smoking status: Former Smoker    Quit date: 06/20/1995  . Smokeless tobacco: Never Used  . Alcohol Use: Yes     Comment: 2-3 weekly    Review of Systems No sore throat No cough No pleuritic pain No wheezing No nasal congestion No post-nasal drainage No sinus pain/pressure No itchy/red eyes ? Earache; right ear feels clogged No hemoptysis No SOB No fever/chills No nausea No vomiting No abdominal pain No diarrhea No urinary symptoms No skin rash No fatigue No myalgias No headache Used OTC meds without relief  Allergies  Lisinopril and Penicillins  Home Medications   Current Outpatient Rx  Name  Route  Sig  Dispense  Refill  . clobetasol (TEMOVATE) 0.05 % GEL   Topical   Apply 1 application topically 2 (two) times daily. For up to 2 weeks.   30 each   0   . losartan (COZAAR) 100 MG tablet   Oral   Take 1 tablet (100 mg total) by mouth daily.   30 tablet   6     Patient needs to make an appointment.   . predniSONE (DELTASONE) 20 MG  tablet   Oral   Take 1 tablet (20 mg total) by mouth 2 (two) times daily. Take with food.   10 tablet   0    BP 112/73  Pulse 83  Temp(Src) 98.1 F (36.7 C) (Oral)  Resp 6  Ht 6' (1.829 m)  Wt 220 lb (99.791 kg)  BMI 29.83 kg/m2  SpO2 98% Physical Exam Nursing notes and Vital Signs reviewed. Appearance:  Patient appears healthy, stated age, and in no acute distress Eyes:  Pupils are equal, round, and reactive to light and accomodation.  Extraocular movement is intact.  Conjunctivae are not inflamed  Ears:  Canals normal.  Tympanic membranes normal.  Nose:  Mildly congested turbinates.  No sinus tenderness.   Pharynx:  Normal Neck:  Supple.  No adenopathy Skin:  No rash present.   ED Course  Procedures  none   Labs Reviewed -  Tympanogram:  Wide left ear; normal right ear       MDM   1. Eustachian tube disorder, right    There is no evidence of bacterial infection today.   Begin prednisone burst. Take plain Mucinex (1200 mg guaifenesin) twice daily for cough and congestion.  Increase fluid intake May use Afrin nasal  spray (or generic oxymetazoline) twice daily for about 5 days.  Also recommend using saline nasal spray several times daily and saline nasal irrigation (AYR is a common brand).  Recommend resuming the prescription nasal spray until improved. Try gently equalizing pressure in ears several times daily; chewing gum can help. Stop all antihistamines for now, and other non-prescription cough/cold preparations. Followup with ENT if not improved about 10 days.    Lattie Haw, MD 06/13/13 [phone]

## 2013-11-11 ENCOUNTER — Other Ambulatory Visit: Payer: Self-pay | Admitting: Family Medicine

## 2014-01-12 ENCOUNTER — Other Ambulatory Visit: Payer: Self-pay | Admitting: Physician Assistant

## 2015-02-09 ENCOUNTER — Telehealth: Payer: Self-pay | Admitting: *Deleted

## 2015-02-09 NOTE — Telephone Encounter (Signed)
Pt's wife would like copy of his immunization to be emailed to [email or web address]. Will give to front ofc staff to send.Loralee Pacas [place]

## 2015-07-09 ENCOUNTER — Other Ambulatory Visit: Payer: Self-pay | Admitting: Physician Assistant
